# Patient Record
Sex: Female | Born: 2000 | Hispanic: No | Marital: Single | Smoking: Never smoker
Health system: Southern US, Community
[De-identification: ages and names within clinical notes are randomized; demographics above are authoritative.]

---

## 2019-04-07 ENCOUNTER — Other Ambulatory Visit: Payer: Self-pay

## 2019-04-07 ENCOUNTER — Encounter (HOSPITAL_COMMUNITY): Payer: Self-pay

## 2019-04-07 ENCOUNTER — Ambulatory Visit (HOSPITAL_COMMUNITY)
Admission: EM | Admit: 2019-04-07 | Discharge: 2019-04-07 | Disposition: A | Discharge status: Home / Self Care | Payer: Medicaid Other | Attending: Internal Medicine | Admitting: Internal Medicine

## 2019-04-07 DIAGNOSIS — M549 Dorsalgia, unspecified: Secondary | ICD-10-CM | POA: Diagnosis not present

## 2019-04-07 MED ORDER — IBUPROFEN 400 MG PO TABS: 400.0000 mg | ORAL_TABLET | Freq: Four times a day (QID) | ORAL | 0 refills | Status: DC | PRN

## 2019-04-07 NOTE — ED Provider Notes (Signed)
[facility]    CSN: [id number] Arrival date & time: 04/07/19  1515      History   Chief Complaint Chief Complaint  Patient presents with  . Abdominal Pain    HPI Erika Henderson is a 18 y.o. female no past medical history comes to urgent care with a 3-day history of left-sided mid back pain.  Onset was fairly sudden and pain is of moderate severity.  Pain is worse on movement and relieved by rest.  No numbness or tingling.  No abdominal distention.  No nausea or vomiting.  No change in bowel movements.  No dysuria urgency or frequency.  No blood in the urine.  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  HPI  History reviewed. No pertinent past medical history.  There are no active problems to display for this patient.   History reviewed. No pertinent surgical history.  OB History   No obstetric history on file.      Home Medications    Prior to Admission medications   Medication Sig Start Date End Date Taking? Authorizing Provider  ibuprofen (ADVIL) 400 MG tablet Take 1 tablet (400 mg total) by mouth every 6 (six) hours as needed. 04/07/19   Trayce Maino, Myrene Galas, MD    Family History History reviewed. No pertinent family history.  Social History Social History   Tobacco Use  . Smoking status: Never Smoker  . Smokeless tobacco: Current User  Substance Use Topics  . Alcohol use: Never    Frequency: Never  . Drug use: Not on file     Allergies   Patient has no known allergies.   Review of Systems Review of Systems  Constitutional: Negative.   HENT: Negative.   Respiratory: Negative.   Cardiovascular: Negative.   Gastrointestinal: Negative.   Genitourinary: Negative.  Negative for dysuria, frequency, genital sores, hematuria and urgency.  Musculoskeletal: Positive for back pain. Negative for arthralgias, joint swelling, myalgias, neck pain and neck stiffness.  Skin: Negative.   Neurological: Negative.  Negative  for dizziness, weakness and light-headedness.  Psychiatric/Behavioral: Negative.      Physical Exam Triage Vital Signs ED Triage Vitals [04/07/19 1543]  Enc Vitals Group     BP (!) 129/44     Pulse Rate 88     Resp 17     Temp 97.8 F (36.6 C)     Temp Source Tympanic     SpO2 100 %     Weight 110 lb (49.9 kg)     Height      Head Circumference      Peak Flow      Pain Score 7     Pain Loc      Pain Edu?      Excl. in [place]?    No data found.  Updated Vital Signs BP (!) 129/44 (BP Location: Left Arm)   Pulse 88   Temp 97.8 F (36.6 C) (Tympanic)   Resp 17   Wt 49.9 kg   LMP 03/16/2019   SpO2 100%   Visual Acuity Right Eye Distance:   Left Eye Distance:   Bilateral Distance:    Right Eye Near:   Left Eye Near:    Bilateral Near:     Physical Exam Vitals signs and nursing note reviewed.  Constitutional:      Appearance: She is not ill-appearing or toxic-appearing.  Cardiovascular:     Rate and Rhythm: Normal rate and regular rhythm.  Pulmonary:  Effort: Pulmonary effort is normal.     Breath sounds: Normal breath sounds. No wheezing or rhonchi.  Abdominal:     General: Abdomen is flat. Bowel sounds are increased.     Palpations: Abdomen is soft.     Tenderness: There is no abdominal tenderness. There is no right CVA tenderness or left CVA tenderness. Negative signs include Murphy's sign, Rovsing's sign, McBurney's sign and psoas sign.     Hernia: No hernia is present.  Skin:    General: Skin is warm.     Capillary Refill: Capillary refill takes less than 2 seconds.     Coloration: Skin is not cyanotic.     Comments: Tenderness on palpation of the right paraspinal muscle over the lumbar spine.  Neurological:     General: No focal deficit present.     Mental Status: She is alert.      UC Treatments / Results  Labs (all labs ordered are listed, but only abnormal results are displayed) Labs Reviewed - No data to display  EKG   Radiology No  results found.  Procedures Procedures (including critical care time)  Medications Ordered in UC Medications - No data to display  Initial Impression / Assessment and Plan / UC Course  I have reviewed the triage vital signs and the nursing notes.  Pertinent labs & imaging results that were available during my care of the patient were reviewed by me and considered in my medical decision making (see chart for details).     1.  Musculoskeletal back pain: Warm compress Ibuprofen 400 mg every 6 hours as needed for back pain Patient has no neurologic signs If pain gets worse patient is advised to return to urgent care for further evaluation. Final Clinical Impressions(s) / UC Diagnoses   Final diagnoses:  Musculoskeletal back pain   Discharge Instructions   None    ED Prescriptions    Medication Sig Dispense Auth. Provider   ibuprofen (ADVIL) 400 MG tablet Take 1 tablet (400 mg total) by mouth every 6 (six) hours as needed. 30 tablet Tamaj Jurgens, Britta MccreedyPhilip O, MD     PDMP not reviewed this encounter.   Merrilee JanskyLamptey, Lilyana Lippman O, MD 04/07/19 [phone]

## 2019-04-07 NOTE — ED Triage Notes (Signed)
Pt states she has pain on the right side of her abdomin  x 3 days. Pt states the pain makes her feel sick. Pt states the pain is sharp and tender.

## 2019-04-21 ENCOUNTER — Other Ambulatory Visit: Payer: Self-pay

## 2019-04-21 ENCOUNTER — Encounter (HOSPITAL_COMMUNITY): Payer: Self-pay

## 2019-04-21 ENCOUNTER — Ambulatory Visit (HOSPITAL_COMMUNITY)
Admission: EM | Admit: 2019-04-21 | Discharge: 2019-04-21 | Disposition: A | Discharge status: Home / Self Care | Payer: Medicaid Other | Attending: Family Medicine | Admitting: Family Medicine

## 2019-04-21 ENCOUNTER — Ambulatory Visit (INDEPENDENT_AMBULATORY_CARE_PROVIDER_SITE_OTHER): Payer: Medicaid Other

## 2019-04-21 DIAGNOSIS — R1011 Right upper quadrant pain: Secondary | ICD-10-CM | POA: Diagnosis not present

## 2019-04-21 LAB — COMPREHENSIVE METABOLIC PANEL
ALT: 12 U/L (ref 0–44)
AST: 18 U/L (ref 15–41)
Albumin: 4.2 g/dL (ref 3.5–5.0)
Alkaline Phosphatase: 53 U/L (ref 38–126)
Anion gap: 10 (ref 5–15)
BUN: 13 mg/dL (ref 6–20)
CO2: 26 mmol/L (ref 22–32)
Calcium: 9.6 mg/dL (ref 8.9–10.3)
Chloride: 100 mmol/L (ref 98–111)
Creatinine, Ser: 0.78 mg/dL (ref 0.44–1.00)
GFR calc Af Amer: >60 mL/min (ref >60)
GFR calc non Af Amer: >60 mL/min (ref >60)
Glucose, Bld: 94 mg/dL (ref 70–99)
Potassium: 4.1 mmol/L (ref 3.5–5.1)
Sodium: 136 mmol/L (ref 135–145)
Total Bilirubin: 0.3 mg/dL (ref 0.3–1.2)
Total Protein: 7.5 g/dL (ref 6.5–8.1)

## 2019-04-21 LAB — CBC WITH DIFFERENTIAL/PLATELET
Abs Immature Granulocytes: 0.04 10*3/uL (ref 0.00–0.07)
Basophils Absolute: 0.1 10*3/uL (ref 0.0–0.1)
Basophils Relative: 1 %
Eosinophils Absolute: 0 10*3/uL (ref 0.0–0.5)
Eosinophils Relative: 0 %
HCT: 38 % (ref 36.0–46.0)
Hemoglobin: 12.9 g/dL (ref 12.0–15.0)
Immature Granulocytes: 0 %
Lymphocytes Relative: 15 %
Lymphs Abs: 1.5 10*3/uL (ref 0.7–4.0)
MCH: 28.1 pg (ref 26.0–34.0)
MCHC: 33.9 g/dL (ref 30.0–36.0)
MCV: 82.8 fL (ref 80.0–100.0)
Monocytes Absolute: 0.3 10*3/uL (ref 0.1–1.0)
Monocytes Relative: 3 %
Neutro Abs: 8.3 10*3/uL — ABNORMAL HIGH (ref 1.7–7.7)
Neutrophils Relative %: 81 %
Platelets: 429 10*3/uL — ABNORMAL HIGH (ref 150–400)
RBC: 4.59 MIL/uL (ref 3.87–5.11)
RDW: 11.6 % (ref 11.5–15.5)
WBC: 10.3 10*3/uL (ref 4.0–10.5)
nRBC: 0 % (ref 0.0–0.2)

## 2019-04-21 MED ORDER — IBUPROFEN 600 MG PO TABS: 600.0000 mg | ORAL_TABLET | Freq: Three times a day (TID) | ORAL | 0 refills | Status: AC

## 2019-04-21 NOTE — ED Provider Notes (Signed)
[facility]   [id number] 04/21/19 Arrival Time: 1761  ASSESSMENT & PLAN:  1. Right upper quadrant abdominal pain     I have personally viewed the imaging studies ordered this visit. AAS: No worrisome finding.  Meds ordered this encounter  Medications  . ibuprofen (ADVIL) 600 MG tablet    Sig: Take 1 tablet (600 mg total) by mouth 3 (three) times daily with meals.    Dispense:  21 tablet    Refill:  0   Question gallbladder related. Discussed.  Pending: Orders Placed This Encounter  . Comprehensive metabolic panel  . CBC with Differential    Follow-up Information    [facility].   Specialty: Emergency Medicine Why: If symptoms worsen in any way. Contact information: [address] [id number] [place] [phone]       Schedule an appointment as soon as possible for a visit  with [facility].   Specialty: General Surgery Contact information: [place] [address] [phone]            Discharge Instructions     You have been seen today for abdominal pain. Your evaluation was not suggestive of any emergent condition requiring medical intervention at this time. We are awaiting the results of the blood tests drawn this evening. However, some abdominal problems make take more time to appear. Therefore, it is very important for you to pay attention to any new symptoms or worsening of your current condition.  Please return here or to the Emergency Department immediately should you begin to feel worse in any way or have any of the following symptoms: increasing or different abdominal pain, persistent vomiting, inability to drink fluids, fevers, or shaking chills.      Reviewed expectations re: course of current medical issues. Questions answered. Outlined signs and symptoms indicating need for more acute intervention. Patient verbalized  understanding. After Visit Summary given.   SUBJECTIVE: History from: patient. Erika Henderson is a 18 y.o. female who presents with complaint of intermittent RUQ abdominal discomfort. Seen here approx 2 weeks ago for R sided back pain. "Feels like it moved to the front now". Discomfort described as dull; without radiation; occasionally wakes her at night. Symptoms are stable since beginning but questions slight worsening over the past several days. Has been going to work; Paediatric nurse. Fever: absent. Aggravating factors: include certain movements involving abdominal muscles. Alleviating factors: have not been identified. Associated symptoms: mild nausea today without emesis. She denies arthralgias, belching, chills, constipation, diarrhea, dysuria, headache, myalgias and sweats. Appetite: normal. PO intake: normal. Pain is not related to eating. Ambulatory without assistance. Urinary symptoms: none. Bowel movements: have not significantly changed; last bowel movement yesterday and without blood. History of similar: no. OTC treatment: occasional ibuprofen; "helps a little".  LMP: finished last week; regular.  History reviewed. No pertinent surgical history.  ROS: As per HPI. All other systems negative.  OBJECTIVE:  Vitals:   04/21/19 1807  BP: 117/65  Pulse: 75  Resp: 16  Temp: 98.1 F (36.7 C)  TempSrc: Temporal  SpO2: 100%    General appearance: alert, oriented, no acute distress HEENT: [place]; AT; oropharynx moist Lungs: clear to auscultation bilaterally; unlabored respirations Heart: regular rate and rhythm Abdomen: soft; without distention; moderate and poorly localized tenderness to palpation over her RUQ; no specific rib tenderness; normal bowel sounds; without masses or organomegaly; without guarding or rebound tenderness Back: without CVA tenderness; FROM  at waist Extremities: without LE edema; symmetrical; without gross deformities Skin: warm and dry Neurologic: normal  gait Psychological: alert and cooperative; normal mood and affect  Labs: No results found for this or any previous visit. Labs Reviewed  COMPREHENSIVE METABOLIC PANEL  CBC WITH DIFFERENTIAL/PLATELET    Imaging: Dg Abd Acute W/chest  Result Date: 04/21/2019 CLINICAL DATA:  Per pt: has been having right side back pain, for two weeks, pain traveled to the right side of ribs, and now pain is in the front. Patient indicated that pain for two weeks posterior right side distal to the scapula, pain radiated to the right lateral mid ribs, for the past three days pain is a stabbing pain anteriorly, distal just under the right anterior lower ribs, today the pain was so intense it made her nauseous. No fever. No history of cardiac or respiratory disease. Non smoker. No surgeries. No history of HBP, no diabetes. EXAM: DG ABDOMEN ACUTE W/ 1V CHEST COMPARISON:  None. FINDINGS: There is no evidence of dilated bowel loops or free intraperitoneal air. No radiopaque calculi or other significant radiographic abnormality is seen. Heart size and mediastinal contours are within normal limits. Both lungs are clear. IMPRESSION: Negative abdominal radiographs.  No acute cardiopulmonary disease. Electronically Signed   By: Amie Portland M.D.   On: 04/21/2019 19:24   No Known Allergies                                       PMH: "Healthy".        Social History   Socioeconomic History  . Marital status: Single    Spouse name: Not on file  . Number of children: Not on file  . Years of education: Not on file  . Highest education level: Not on file  Occupational History  . Not on file  Social Needs  . Financial resource strain: Not on file  . Food insecurity    Worry: Not on file    Inability: Not on file  . Transportation needs    Medical: Not on file    Non-medical: Not on file  Tobacco Use  . Smoking status: Never Smoker  . Smokeless tobacco: Current User  Substance and Sexual Activity  . Alcohol use:  Never    Frequency: Never  . Drug use: Never  . Sexual activity: Yes  Lifestyle  . Physical activity    Days per week: Not on file    Minutes per session: Not on file  . Stress: Not on file  Relationships  . Social Musician on phone: Not on file    Gets together: Not on file    Attends religious service: Not on file    Active member of club or organization: Not on file    Attends meetings of clubs or organizations: Not on file    Relationship status: Not on file  . Intimate partner violence    Fear of current or ex partner: Not on file    Emotionally abused: Not on file    Physically abused: Not on file    Forced sexual activity: Not on file  Other Topics Concern  . Not on file  Social History Narrative  . Not on file   Family History  Problem Relation Age of Onset  . Healthy Father      Mardella Layman, MD 04/26/19 [phone]

## 2019-04-21 NOTE — ED Triage Notes (Signed)
Patient presents to Urgent Care with complaints of RUQ abdominal pain near her rib cage that radiates down her side since a few weeks ago. Patient reports it has progressively gotten worse, felt nauseous earlier today but did not throw up.

## 2019-04-21 NOTE — Discharge Instructions (Addendum)
You have been seen today for abdominal pain. Your evaluation was not suggestive of any emergent condition requiring medical intervention at this time. We are awaiting the results of the blood tests drawn this evening. However, some abdominal problems make take more time to appear. Therefore, it is very important for you to pay attention to any new symptoms or worsening of your current condition.  Please return here or to the Emergency Department immediately should you begin to feel worse in any way or have any of the following symptoms: increasing or different abdominal pain, persistent vomiting, inability to drink fluids, fevers, or shaking chills.

## 2019-06-23 ENCOUNTER — Ambulatory Visit (HOSPITAL_COMMUNITY)
Admission: EM | Admit: 2019-06-23 | Discharge: 2019-06-23 | Disposition: A | Discharge status: Home / Self Care | Payer: Medicaid Other | Attending: Family Medicine | Admitting: Family Medicine

## 2019-06-23 ENCOUNTER — Encounter (HOSPITAL_COMMUNITY): Payer: Self-pay

## 2019-06-23 ENCOUNTER — Other Ambulatory Visit: Payer: Self-pay

## 2019-06-23 DIAGNOSIS — Z20822 Contact with and (suspected) exposure to covid-19: Secondary | ICD-10-CM

## 2019-06-23 DIAGNOSIS — Z20828 Contact with and (suspected) exposure to other viral communicable diseases: Secondary | ICD-10-CM

## 2019-06-23 NOTE — ED Provider Notes (Signed)
Erika Henderson    CSN: [id number] Arrival date & time: 06/23/19  1645      History   Chief Complaint Chief Complaint  Patient presents with  . Covid Exposure    HPI Erika Henderson is a 18 y.o. female.   She is presenting with a possible exposure to Covid.  Her roommate tested positive today.  She has been asymptomatic.  She has been living in a shared apartment with a roommate.  She denies any close encounter with her roommate.  Roommate was severely symptomatic.  HPI  History reviewed. No pertinent past medical history.  There are no active problems to display for this patient.   History reviewed. No pertinent surgical history.  OB History   No obstetric history on file.      Home Medications    Prior to Admission medications   Medication Sig Start Date End Date Taking? Authorizing Provider  ibuprofen (ADVIL) 600 MG tablet Take 1 tablet (600 mg total) by mouth 3 (three) times daily with meals. 04/21/19   Vanessa Kick, MD    Family History Family History  Problem Relation Age of Onset  . Healthy Father     Social History Social History   Tobacco Use  . Smoking status: Never Smoker  . Smokeless tobacco: Current User  Substance Use Topics  . Alcohol use: Never    Frequency: Never  . Drug use: Yes    Types: Marijuana     Allergies   Patient has no known allergies.   Review of Systems Review of Systems  Constitutional: Negative for fever.  HENT: Negative for congestion.   Respiratory: Negative for cough.   Cardiovascular: Negative for chest pain.  Gastrointestinal: Negative for abdominal pain.  Musculoskeletal: Negative for gait problem.  Skin: Negative for color change.  Neurological: Negative for weakness.  Hematological: Negative for adenopathy.     Physical Exam Triage Vital Signs ED Triage Vitals  Enc Vitals Group     BP 06/23/19 1733 (!) 122/54     Pulse Rate 06/23/19 1733 78     Resp 06/23/19 1733 16     Temp  06/23/19 1733 98.8 F (37.1 C)     Temp Source 06/23/19 1733 Oral     SpO2 06/23/19 1733 100 %     Weight --      Height --      Head Circumference --      Peak Flow --      Pain Score 06/23/19 1732 0     Pain Loc --      Pain Edu? --      Excl. in [place]? --    No data found.  Updated Vital Signs BP (!) 122/54 (BP Location: Left Arm)   Pulse 78   Temp 98.8 F (37.1 C) (Oral)   Resp 16   SpO2 100%   Visual Acuity Right Eye Distance:   Left Eye Distance:   Bilateral Distance:    Right Eye Near:   Left Eye Near:    Bilateral Near:     Physical Exam Gen: NAD, alert, cooperative with exam, well-appearing ENT: normal lips, normal nasal mucosa,  Eye: normal EOM, normal conjunctiva and lids CV:  no edema, +2 pedal pulses   Resp: no accessory muscle use, non-labored,   Skin: no rashes, no areas of induration  Neuro: normal tone, normal sensation to touch Psych:  normal insight, alert and oriented MSK: normal gait, normal strength   UC Treatments /  Results  Labs (all labs ordered are listed, but only abnormal results are displayed) Labs Reviewed  NOVEL CORONAVIRUS, NAA (HOSP ORDER, SEND-OUT TO REF LAB; TAT 18-24 HRS)    EKG   Radiology No results found.  Procedures Procedures (including critical care time)  Medications Ordered in UC Medications - No data to display  Initial Impression / Assessment and Plan / UC Course  I have reviewed the triage vital signs and the nursing notes.  Pertinent labs & imaging results that were available during my care of the patient were reviewed by me and considered in my medical decision making (see chart for details).     Erika Henderson is a 18 year old female with possible exposure to Covid.  A test was taken.  She was counseled on supportive care and quarantining.  She will be provided results when they return.  Given occasions to follow-up and return.  Final Clinical Impressions(s) / UC Diagnoses   Final diagnoses:  Suspected  COVID-19 virus infection     Discharge Instructions     Please quarantine until you get your results      ED Prescriptions    None     PDMP not reviewed this encounter.   Myra Rude, MD 06/23/19 [phone]

## 2019-06-23 NOTE — Discharge Instructions (Signed)
Please quarantine until you get your results

## 2019-06-23 NOTE — ED Triage Notes (Signed)
Patient presents to Urgent Care with complaints of needing a covid test since her roommate tested positive for covid today. Patient reports her roommate has been feeling badly but the pt has no symptoms.

## 2019-06-25 LAB — NOVEL CORONAVIRUS, NAA (HOSP ORDER, SEND-OUT TO REF LAB; TAT 18-24 HRS): SARS-CoV-2, NAA: NOT DETECTED

## 2019-12-29 ENCOUNTER — Emergency Department (HOSPITAL_COMMUNITY)
Admission: EM | Admit: 2019-12-29 | Discharge: 2019-12-29 | Disposition: A | Discharge status: Home / Self Care | Payer: Medicaid Other | Attending: Emergency Medicine | Admitting: Emergency Medicine

## 2019-12-29 ENCOUNTER — Emergency Department (HOSPITAL_COMMUNITY): Payer: Medicaid Other

## 2019-12-29 ENCOUNTER — Encounter (HOSPITAL_COMMUNITY): Payer: Self-pay

## 2019-12-29 ENCOUNTER — Other Ambulatory Visit: Payer: Self-pay

## 2019-12-29 DIAGNOSIS — W2219XA Striking against or struck by other automobile airbag, initial encounter: Secondary | ICD-10-CM | POA: Diagnosis not present

## 2019-12-29 DIAGNOSIS — F17228 Nicotine dependence, chewing tobacco, with other nicotine-induced disorders: Secondary | ICD-10-CM | POA: Diagnosis not present

## 2019-12-29 DIAGNOSIS — M79642 Pain in left hand: Secondary | ICD-10-CM | POA: Diagnosis present

## 2019-12-29 DIAGNOSIS — Y92410 Unspecified street and highway as the place of occurrence of the external cause: Secondary | ICD-10-CM | POA: Insufficient documentation

## 2019-12-29 DIAGNOSIS — F1210 Cannabis abuse, uncomplicated: Secondary | ICD-10-CM | POA: Insufficient documentation

## 2019-12-29 DIAGNOSIS — Y998 Other external cause status: Secondary | ICD-10-CM | POA: Diagnosis not present

## 2019-12-29 DIAGNOSIS — M25522 Pain in left elbow: Secondary | ICD-10-CM | POA: Diagnosis not present

## 2019-12-29 DIAGNOSIS — Y9389 Activity, other specified: Secondary | ICD-10-CM | POA: Insufficient documentation

## 2019-12-29 MED ORDER — IBUPROFEN 800 MG PO TABS
800.0000 mg | ORAL_TABLET | Freq: Once | ORAL | Status: AC
  Administered 2019-12-29: 800 mg via ORAL
  Filled 2019-12-29: qty 1
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
## 2019-12-29 NOTE — Discharge Instructions (Signed)
Take ibuprofen 3 times a day with meals.  Do not take other anti-inflammatories at the same time (Advil, Motrin, naproxen, Aleve). You may supplement with Tylenol if you need further pain control. Use ice packs or heating pads if this helps control your pain. Use muscle creams such as Salonpas, icy hot, BenGay, Biofreeze to help with pain control. You likely have continued muscle stiffness and soreness over the next couple days.  Follow-up with primary care in 1 week if your symptoms are not improving. Return to the emergency room if you develop vision changes, vomiting, slurred speech, numbness, loss of bowel or bladder control, or any new or worsening symptoms.
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
## 2020-11-12 ENCOUNTER — Emergency Department (HOSPITAL_COMMUNITY): Payer: Medicaid Other

## 2020-11-12 ENCOUNTER — Encounter (HOSPITAL_COMMUNITY): Payer: Self-pay | Admitting: *Deleted

## 2020-11-12 ENCOUNTER — Emergency Department (HOSPITAL_COMMUNITY)
Admission: EM | Admit: 2020-11-12 | Discharge: 2020-11-12 | Disposition: A | Discharge status: Home / Self Care | Payer: Medicaid Other | Attending: Emergency Medicine | Admitting: Emergency Medicine

## 2020-11-12 DIAGNOSIS — O4690 Antepartum hemorrhage, unspecified, unspecified trimester: Secondary | ICD-10-CM

## 2020-11-12 DIAGNOSIS — F17200 Nicotine dependence, unspecified, uncomplicated: Secondary | ICD-10-CM | POA: Insufficient documentation

## 2020-11-12 DIAGNOSIS — F1210 Cannabis abuse, uncomplicated: Secondary | ICD-10-CM | POA: Diagnosis not present

## 2020-11-12 DIAGNOSIS — Z3A01 Less than 8 weeks gestation of pregnancy: Secondary | ICD-10-CM | POA: Insufficient documentation

## 2020-11-12 DIAGNOSIS — O4691 Antepartum hemorrhage, unspecified, first trimester: Secondary | ICD-10-CM | POA: Diagnosis not present

## 2020-11-12 DIAGNOSIS — O26891 Other specified pregnancy related conditions, first trimester: Secondary | ICD-10-CM | POA: Diagnosis present

## 2020-11-12 DIAGNOSIS — R58 Hemorrhage, not elsewhere classified: Secondary | ICD-10-CM

## 2020-11-12 LAB — URINALYSIS, ROUTINE W REFLEX MICROSCOPIC: RBC / HPF: >50 RBC/hpf — ABNORMAL HIGH (ref 0–5)

## 2020-11-12 LAB — COMPREHENSIVE METABOLIC PANEL
ALT: 11 U/L (ref 0–44)
AST: 18 U/L (ref 15–41)
Albumin: 4.5 g/dL (ref 3.5–5.0)
Alkaline Phosphatase: 42 U/L (ref 38–126)
Anion gap: 9 (ref 5–15)
BUN: 10 mg/dL (ref 6–20)
CO2: 24 mmol/L (ref 22–32)
Calcium: 9.3 mg/dL (ref 8.9–10.3)
Chloride: 107 mmol/L (ref 98–111)
Creatinine, Ser: 0.83 mg/dL (ref 0.44–1.00)
GFR, Estimated: >60 mL/min (ref >60)
Glucose, Bld: 92 mg/dL (ref 70–99)
Potassium: 3.5 mmol/L (ref 3.5–5.1)
Sodium: 140 mmol/L (ref 135–145)
Total Bilirubin: 0.6 mg/dL (ref 0.3–1.2)
Total Protein: 7.5 g/dL (ref 6.5–8.1)

## 2020-11-12 LAB — CBC WITH DIFFERENTIAL/PLATELET
Abs Immature Granulocytes: 0.02 10*3/uL (ref 0.00–0.07)
Basophils Absolute: 0.1 10*3/uL (ref 0.0–0.1)
Basophils Relative: 1 %
Eosinophils Absolute: 0.1 10*3/uL (ref 0.0–0.5)
Eosinophils Relative: 1 %
HCT: 44.4 % (ref 36.0–46.0)
Hemoglobin: 14.5 g/dL (ref 12.0–15.0)
Immature Granulocytes: 0 %
Lymphocytes Relative: 20 %
Lymphs Abs: 1.2 10*3/uL (ref 0.7–4.0)
MCH: 27.3 pg (ref 26.0–34.0)
MCHC: 32.7 g/dL (ref 30.0–36.0)
MCV: 83.5 fL (ref 80.0–100.0)
Monocytes Absolute: 0.3 10*3/uL (ref 0.1–1.0)
Monocytes Relative: 5 %
Neutro Abs: 4.4 10*3/uL (ref 1.7–7.7)
Neutrophils Relative %: 73 %
Platelets: 282 10*3/uL (ref 150–400)
RBC: 5.32 MIL/uL — ABNORMAL HIGH (ref 3.87–5.11)
RDW: 12.8 % (ref 11.5–15.5)
WBC: 6.1 10*3/uL (ref 4.0–10.5)
nRBC: 0 % (ref 0.0–0.2)

## 2020-11-12 LAB — WET PREP, GENITAL
Clue Cells Wet Prep HPF POC: NEGATIVE — AB
Sperm: NEGATIVE
Trich, Wet Prep: NEGATIVE — AB
Yeast Wet Prep HPF POC: NEGATIVE — AB

## 2020-11-12 LAB — ABO/RH: ABO/RH(D): O POS

## 2020-11-12 LAB — HCG, QUANTITATIVE, PREGNANCY: hCG, Beta Chain, Quant, S: 337 m[IU]/mL — ABNORMAL HIGH (ref <5)

## 2020-11-12 NOTE — Discharge Instructions (Addendum)
Like we discussed, below is the contact information for Dr. Vergie Living.  His office should be contacting you to schedule an appointment in 2 days so that you can be reevaluated and have your quantitative hCG test repeated.  Please continue to monitor your symptoms closely.  If you feel like you are having worsening bleeding, shortness of breath, lightheadedness, please come back to the emergency department so that you can be reevaluated.  Otherwise, please make sure you follow-up with Dr. Vergie Living in 2 days.  It was a pleasure to meet you both.

## 2020-11-12 NOTE — ED Triage Notes (Signed)
Pt had positive pregnancy test on 4/20. She reports bleeding upon waking up this morning. LMP 09/19/20.

## 2020-11-12 NOTE — ED Provider Notes (Signed)
[facility]-EMERGENCY DEPT Provider Note   CSN: [id number] Arrival date & time: 11/12/20  1203     History Chief Complaint  Patient presents with  . Vaginal Bleeding    Erika Henderson is a 20 y.o. female.  HPI Pt is a G1 20 y/o female that presents to the ED due to vaginal spotting. She states that her LNMP was 3/2. She has been having unprotected sex with her boyfriend. She states that 4/20 she had a positive home pregnancy test and has a f/u with the health department tomorrow for confirmatory testing. This AM she woke up and noticed a small amount of blood when wiping. She then noticed a larger amount of blood when wiping a 2nd time. No further bleeding. She reports mild suprapubic cramping. No n/v/d, CP, SOB, urinary complaints, vaginal discharge. This is her first pregnancy. No history of miscarriages.     History reviewed. No pertinent past medical history.  There are no problems to display for this patient.   History reviewed. No pertinent surgical history.   OB History    Gravida  1   Para      Term      Preterm      AB      Living        SAB      IAB      Ectopic      Multiple      Live Births              Family History  Problem Relation Age of Onset  . Healthy Father     Social History   Tobacco Use  . Smoking status: Never Smoker  . Smokeless tobacco: Current User  Vaping Use  . Vaping Use: Never used  Substance Use Topics  . Alcohol use: Never  . Drug use: Yes    Types: Marijuana    Home Medications Prior to Admission medications   Medication Sig Start Date End Date Taking? Authorizing Provider  Prenatal Vit-Fe Fumarate-FA (MULTIVITAMIN-PRENATAL) 27-0.8 MG TABS tablet Take 1 tablet by mouth daily at 12 noon.   Yes [provider]  ibuprofen (ADVIL) 600 MG tablet Take 1 tablet (600 mg total) by mouth 3 (three) times daily with meals. Patient not taking: Reported on 11/12/2020 04/21/19    Mardella LaymanHagler, Brian, MD    Allergies    Patient has no known allergies.  Review of Systems   Review of Systems  All other systems reviewed and are negative. Ten systems reviewed and are negative for acute change, except as noted in the HPI.   Physical Exam Updated Vital Signs BP (!) 147/111   Pulse (!) 117   Temp 98 F (36.7 C)   Resp 19   LMP 09/21/2020   SpO2 97%   Physical Exam Vitals and nursing note reviewed.  Constitutional:      General: She is not in acute distress.    Appearance: Normal appearance. She is not ill-appearing, toxic-appearing or diaphoretic.  HENT:     Head: Normocephalic and atraumatic.     Right Ear: External ear normal.     Left Ear: External ear normal.     Nose: Nose normal.     Mouth/Throat:     Mouth: Mucous membranes are moist.     Pharynx: Oropharynx is clear. No oropharyngeal exudate or posterior oropharyngeal erythema.  Eyes:     General: No scleral icterus.       Right eye:  No discharge.        Left eye: No discharge.     Extraocular Movements: Extraocular movements intact.     Conjunctiva/sclera: Conjunctivae normal.  Cardiovascular:     Rate and Rhythm: Normal rate and regular rhythm.     Pulses: Normal pulses.     Heart sounds: Normal heart sounds. No murmur heard. No friction rub. No gallop.   Pulmonary:     Effort: Pulmonary effort is normal. No respiratory distress.     Breath sounds: Normal breath sounds. No stridor. No wheezing, rhonchi or rales.  Abdominal:     General: Abdomen is flat.     Palpations: Abdomen is soft.     Tenderness: There is abdominal tenderness.     Comments: Abdomen is flat and soft. Very mild tenderness noted in the suprapubic region with deep palpation.  Genitourinary:    Comments: Female nursing chaperone present. Normal appearing vulvar region. Normal appearing vaginal mucosa. Moderate amount of blood noted in the vaginal vault. No clots or obvious POC. Closed retroverted cervical os. No CMT or adnexal  tenderness. Musculoskeletal:        General: Normal range of motion.     Cervical back: Normal range of motion and neck supple. No tenderness.  Skin:    General: Skin is warm and dry.  Neurological:     General: No focal deficit present.     Mental Status: She is alert and oriented to person, place, and time.  Psychiatric:        Mood and Affect: Mood normal.        Behavior: Behavior normal.    ED Results / Procedures / Treatments   Labs (all labs ordered are listed, but only abnormal results are displayed) Labs Reviewed  WET PREP, GENITAL - Abnormal; Notable for the following components:      Result Value   Yeast Wet Prep HPF POC NEGATIVE (*)    Trich, Wet Prep NEGATIVE (*)    Clue Cells Wet Prep HPF POC NEGATIVE (*)    WBC, Wet Prep HPF POC MANY (*)    All other components within normal limits  CBC WITH DIFFERENTIAL/PLATELET - Abnormal; Notable for the following components:   RBC 5.32 (*)    All other components within normal limits  URINALYSIS, ROUTINE W REFLEX MICROSCOPIC - Abnormal; Notable for the following components:   Color, Urine RED (*)    APPearance TURBID (*)    Glucose, UA   (*)    Value: TEST NOT REPORTED DUE TO COLOR INTERFERENCE OF URINE PIGMENT   Hgb urine dipstick   (*)    Value: TEST NOT REPORTED DUE TO COLOR INTERFERENCE OF URINE PIGMENT   Bilirubin Urine   (*)    Value: TEST NOT REPORTED DUE TO COLOR INTERFERENCE OF URINE PIGMENT   Ketones, ur   (*)    Value: TEST NOT REPORTED DUE TO COLOR INTERFERENCE OF URINE PIGMENT   Protein, ur   (*)    Value: TEST NOT REPORTED DUE TO COLOR INTERFERENCE OF URINE PIGMENT   Nitrite   (*)    Value: TEST NOT REPORTED DUE TO COLOR INTERFERENCE OF URINE PIGMENT   Leukocytes,Ua   (*)    Value: TEST NOT REPORTED DUE TO COLOR INTERFERENCE OF URINE PIGMENT   RBC / HPF >50 (*)    Bacteria, UA RARE (*)    All other components within normal limits  HCG, QUANTITATIVE, PREGNANCY - Abnormal; Notable for the following  components:  hCG, Beta Chain, Quant, S 337 (*)    All other components within normal limits  COMPREHENSIVE METABOLIC PANEL  ABO/RH  GC/CHLAMYDIA PROBE AMP ([place]) NOT AT [facility]   EKG None  Radiology US OB LESS THAN 14 WEEKS WITH OB TRANSVAGINAL  Result Date: 11/12/2020 CLINICAL DATA:  Vaginal bleeding. Estimated gestational age of [redacted] weeks, 5 days by LMP. EXAM: OBSTETRIC <14 WK [place] AND TRANSVAGINAL OB US TECHNIQUE: Both transabdominal and transvaginal ultrasound examinations were performed for complete evaluation of the gestation as well as the maternal uterus, adnexal regions, and pelvic cul-de-sac. Transvaginal technique was performed to assess early pregnancy. COMPARISON:  None. FINDINGS: Intrauterine gestational sac: None. Maternal uterus/adnexae: Unremarkable. IMPRESSION: 1. No IUP is visualized. By definition, in the setting of a positive pregnancy test, this reflects a pregnancy of unknown location. Differential considerations include early normal IUP, abnormal IUP/missed abortion, or nonvisualized ectopic pregnancy. Serial beta HCG is suggested. Consider repeat pelvic ultrasound in 14 days. Electronically Signed   By: Obie Dredge M.D.   On: 11/12/2020 14:37    Procedures Procedures   Medications Ordered in ED Medications - No data to display  ED Course  I have reviewed the triage vital signs and the nursing notes.  Pertinent labs & imaging results that were available during my care of the patient were reviewed by me and considered in my medical decision making (see chart for details).  Clinical Course as of 11/12/20 1933  Mon Nov 12, 2020  1449 I spoke to Dr. Vergie Living with OB/GYN.  He states that his office will reach out to the patient and schedule an appointment with her to follow-up in 2 days for repeat quantitative hCG. [LJ]    Clinical Course User Index [LJ] Placido Sou, PA-C   MDM Rules/Calculators/A&P                          Pt is a 20 y.o. female who  presents to the emergency department due to vaginal bleeding.  She recently had a positive home pregnancy test 5 days ago and began bleeding this morning.  Labs: CBC with an RBC of 5.32. CMP within normal limits. Beta hCG quantitative of 09/20/2005. UA red and turbid showing greater than 50 RBCs and rare bacteria. Wet prep shows many white blood cells. Gonorrhea/chlamydia is pending. ABO/Rh is O+  Imaging: Pelvic ultrasound shows no IUP visualized.  They say by definition in the setting of a positive pregnancy test is reflecting pregnancy of unknown location.  I, Placido Sou, PA-C, personally reviewed and evaluated these images and lab results as part of my medical decision-making.  Patient with vaginal bleeding in first trimester of pregnancy.  She does have a moderate amount of bleeding on my exam but no POC or clots noted.  Very mild tenderness noted in the suprapubic region.  No cervical motion tenderness or adnexal tenderness.  Patient discussed with Dr. Vergie Living who is on-call with OB/GYN.  He states that his office will reach out to the patient and schedule a follow-up appointment for repeat quantitative testing in 2 days.  This was discussed with the patient at length.  She verbalized understanding the above plan.  Feel the patient is stable for discharge at this time and she is agreeable.  She was given very strict return precautions and knows to return to the emergency department with any new or worsening symptoms.  Her questions were answered and she was amicable at the time  of discharge.  Note: Portions of this report may have been transcribed using voice recognition software. Every effort was made to ensure accuracy; however, inadvertent computerized transcription errors may be present.   Final Clinical Impression(s) / ED Diagnoses Final diagnoses:  Vaginal bleeding in pregnancy   Rx / DC Orders ED Discharge Orders    None       Placido Sou, PA-C 11/12/20  1933    Arby Barrette, MD 11/20/20 1345

## 2020-11-13 LAB — GC/CHLAMYDIA PROBE AMP (~~LOC~~) NOT AT ARMC
Chlamydia: NEGATIVE
Comment: NEGATIVE
Comment: NORMAL
Neisseria Gonorrhea: NEGATIVE

## 2020-11-14 ENCOUNTER — Ambulatory Visit (INDEPENDENT_AMBULATORY_CARE_PROVIDER_SITE_OTHER): Payer: Medicaid Other

## 2020-11-14 ENCOUNTER — Other Ambulatory Visit: Payer: Self-pay

## 2020-11-14 VITALS — Ht 63.0 in | Wt 106.0 lb

## 2020-11-14 DIAGNOSIS — O4690 Antepartum hemorrhage, unspecified, unspecified trimester: Secondary | ICD-10-CM

## 2020-11-14 LAB — BETA HCG QUANT (REF LAB): hCG Quant: 51 m[IU]/mL

## 2020-11-14 NOTE — Progress Notes (Signed)
Patient was assessed and managed by nursing staff during this encounter. I have reviewed the chart and agree with the documentation and plan. I have also made any necessary editorial changes.  Quant HCG trending down. Patient without any pelvic pain and vaginal bleeding is slowly improving. Plan for repeat quant HCG in 1 week with provider visit. Patient with likely spontaneous miscarriage  Catalina Antigua, MD 11/14/2020 2:27 PM

## 2020-11-14 NOTE — Progress Notes (Signed)
Pt here today for Stat beta hcg for followPt states still having vaginal bleeding and passed only 1 clot since Monday after leaving ER. Pt changing pad every 2-3 hours, not soaking. Denies any cramps or pain.

## 2020-11-14 NOTE — Progress Notes (Signed)
Left message on pt's phone and emergency contact for results. Pt advised to return call for results and recommendations per Dr Jolayne Panther.   Judeth Cornfield, RN   Recalled pt and spoke with pt and SO. Pt advised recommendations per Dr Jolayne Panther. Pt has appt with provider and repeat beta on 11/22/20. Pt agreeable to date and time of appt.   Judeth Cornfield, RN 11/14/20

## 2020-11-22 ENCOUNTER — Other Ambulatory Visit: Payer: Self-pay

## 2020-11-22 ENCOUNTER — Ambulatory Visit (INDEPENDENT_AMBULATORY_CARE_PROVIDER_SITE_OTHER): Payer: Medicaid Other | Admitting: Family Medicine

## 2020-11-22 ENCOUNTER — Encounter: Payer: Self-pay | Admitting: Family Medicine

## 2020-11-22 VITALS — BP 118/82 | HR 90 | Wt 109.6 lb

## 2020-11-22 DIAGNOSIS — O3680X0 Pregnancy with inconclusive fetal viability, not applicable or unspecified: Secondary | ICD-10-CM | POA: Insufficient documentation

## 2020-11-22 NOTE — Progress Notes (Signed)
GYNECOLOGY OFFICE VISIT NOTE  History:   Erika Henderson is a 20 y.o. G1P0 here today for follow up of pregnancy of unknown location. She denies any abnormal vaginal discharge, bleeding, pelvic pain or other concerns.      SAB -positive UPT 4/20, VB reported 4/25 -hcg 337-->51, u/s demonstrated no IUP -today denies abdominal cramping or VB -unplanned but desired pregnancy, feeling sad -not currently on contraception  No past medical history on file.  No past surgical history on file.  The following portions of the patient's history were reviewed and updated as appropriate: allergies, current medications, past family history, past medical history, past social history, past surgical history and problem list.    Review of Systems:  Pertinent items noted in HPI and remainder of comprehensive ROS otherwise negative.  Physical Exam:  BP 118/82   Pulse 90   Wt 109 lb 9.6 oz (49.7 kg)   LMP 09/21/2020   Breastfeeding Unknown   BMI 19.41 kg/m  CONSTITUTIONAL: Well-developed, well-nourished female in no acute distress.  HEENT:  Normocephalic, atraumatic. External right and left ear normal. No scleral icterus.  NECK: Normal range of motion, supple, no masses noted on observation SKIN: No rash noted. Not diaphoretic. No erythema. No pallor. MUSCULOSKELETAL: Normal range of motion. No edema noted. NEUROLOGIC: Alert and oriented to person, place, and time. Normal muscle tone coordination. No cranial nerve deficit noted. PSYCHIATRIC: Normal mood and affect. Normal behavior. Normal judgment and thought content. CARDIOVASCULAR: Normal heart rate noted RESPIRATORY: Effort and breath sounds normal, no problems with respiration noted ABDOMEN: No masses noted. No other overt distention noted.   PELVIC: Normal appearing external genitalia; normal urethral meatus; normal appearing vaginal mucosa and cervix.  No abnormal discharge noted.  Normal uterine size, no other palpable masses, no  uterine or adnexal tenderness. Performed in the presence of a chaperone  Labs and Imaging No results found for this or any previous visit (from the past 168 hour(s)). US OB LESS THAN 14 WEEKS WITH OB TRANSVAGINAL  Result Date: 11/12/2020 CLINICAL DATA:  Vaginal bleeding. Estimated gestational age of [redacted] weeks, 5 days by LMP. EXAM: OBSTETRIC <14 WK [place] AND TRANSVAGINAL OB US TECHNIQUE: Both transabdominal and transvaginal ultrasound examinations were performed for complete evaluation of the gestation as well as the maternal uterus, adnexal regions, and pelvic cul-de-sac. Transvaginal technique was performed to assess early pregnancy. COMPARISON:  None. FINDINGS: Intrauterine gestational sac: None. Maternal uterus/adnexae: Unremarkable. IMPRESSION: 1. No IUP is visualized. By definition, in the setting of a positive pregnancy test, this reflects a pregnancy of unknown location. Differential considerations include early normal IUP, abnormal IUP/missed abortion, or nonvisualized ectopic pregnancy. Serial beta HCG is suggested. Consider repeat pelvic ultrasound in 14 days. Electronically Signed   By: Obie Dredge M.D.   On: 11/12/2020 14:37      Assessment and Plan:    1. Pregnancy of unknown anatomic location -Patient with downtrending quant 337-->51, no IUP on ultrasound 11/12/20 -Today patient is asymptomatic -Will repeat quant and continue to trend to <5, given patient is asymptomatic no need for further ultrasound. Suspect likely SAB. -Discussed implications of SAB, precautions, contraception, family planning. Patient elects to use condoms at this time, advised to wait at least 1 menstrual cycle prior to trying to conceive. Advised to start prenatal vitamins and avoid teratogens while not on contraception. -Encouraged to RTC for annual gyn visit, patient needs pap smear. - Beta hCG quant (ref lab)   Routine preventative health maintenance measures emphasized.  Please refer to After Visit Summary for  other counseling recommendations.   No follow-ups on file.    Total face-to-face time with patient: 30 minutes.  Over 50% of encounter was spent on counseling and coordination of care.   Alric Seton, MD OB Fellow, Faculty [facility] 11/22/2020 10:49 AM

## 2020-11-22 NOTE — Patient Instructions (Addendum)
-wait at least 1 menstrual cycle prior to trying to conceive again -take prenatal vitamins daily -avoid alcohol, drugs, meds that are harmful to baby while not on birth control -we will trend hcg (pregnancy hormone levels) to less than 5 -return for annual gyn exam (pap smear) etc.  Miscarriage A miscarriage is the loss of a pregnancy before the 20th week of pregnancy. Sometimes, a pregnancy ends before a woman knows that she is pregnant. If you lose a pregnancy, talk with your doctor about:  Questions you have about the loss of your baby.  How to work through your grief.  Plans for future pregnancy. What are the causes? Many times, the cause of this condition is not known. What increases the risk? These things may make a pregnant woman more likely to lose a pregnancy: Certain health conditions  Conditions that affect hormones, such as: ? Thyroid disease. ? Polycystic ovary syndrome.  Diabetes.  A disease that causes the body's disease-fighting system to attack itself by mistake.  Infections.  Bleeding problems.  Being very overweight. Lifestyle factors  Using products that have tobacco or nicotine in them.  Being around tobacco smoke.  Having alcohol.  Having a lot of caffeine.  Using drugs. Problems with reproductive organs or parts  Having a cervix that opens and thins before your due date. The cervix is the lowest part of your womb.  Having Asherman syndrome, which leads to: ? Scars in the womb. ? The womb being abnormal in shape.  Growths (fibroids) in the womb.  Problems in the body that are present at birth.  Infection of the cervix or womb. Personal or health history  Injury.  Having lost a pregnancy before.  Being younger than age 35 or older than age 61.  Being around a harmful substance, such as radiation.  Having lead or other heavy metals in: ? Things you eat or drink. ? The air around you.  Using certain medicines. What are the  signs or symptoms?  Blood or spots of blood coming from the vagina. You may also have cramps or pain.  Pain or cramps in the belly or low back.  Fluid or tissue coming out of the vagina. How is this treated? Sometimes, treatment is not needed. If you need treatment, you may be treated with:  A procedure to open the cervix more and take tissue out of the womb.  Medicines. You may get a shot of medicine called Rho(D) immune globulin. Follow these instructions at home: Medicines  Take over-the-counter and prescription medicines only as told by your doctor.  If you were prescribed antibiotic medicine, take it as told by your doctor. Do not stop taking it even if you start to feel better. Activity  Rest as told by your doctor. Ask your doctor what activities are safe for you.  Have someone help you at home during this time. General instructions  Watch how much tissue comes out of the vagina.  Watch the size of any blood clots that come out of the vagina.  Do not have sex or douche until your doctor says it is okay.  Do not put things, such as tampons, in your vagina until your doctor says it is okay.  To help you and your partner with grieving: ? Talk with your doctor. ? See a Veterinary surgeon.  When you are ready, talk with your doctor about: ? Things to do for your health. ? How you can be healthy if you get pregnant again.  Keep all follow-up visits.   Where to find more information  [employer]necologists: acog.org  [place] Department of Health and Cyt[employer] of Women's Health: http://hoffman.com/ Contact a doctor if:  You have a fever or chills.  There is bad-smelling fluid coming from the vagina.  You have more bleeding.  Tissue or clots of blood come out of your vagina. Get help right away if:  You have very bad cramps or pain in your back or belly.  You soak more than 2 large pads in an hour for more than 2  hours.  You get light-headed or weak.  You faint.  You feel sad, and you have sad thoughts a lot of the time.  You think about hurting yourself. Get help right awayif you feel like you may hurt yourself or others, or have thoughts about taking your own life. Go to your nearest emergency room or:  Call your local emergency services (911 in the [place]  Call the National Suicide Prevention Lifeline at 7854842512. This is open 24 hours a day.  Text the Crisis Text Line at 419-301-2030. Summary  A miscarriage is the loss of a pregnancy before the 20th week of pregnancy. Sometimes, a pregnancy ends before a woman knows that she is pregnant.  Follow instructions from your doctor about medicines and activity.  To help you and your partner with grieving, talk with your doctor or a counselor.  Keep all follow-up visits. This information is not intended to replace advice given to you by your health care provider. Make sure you discuss any questions you have with your health care provider. Document Revised: 01/06/2020 Document Reviewed: 01/06/2020 Elsevier Patient Education  2021 [employer].

## 2020-11-23 LAB — BETA HCG QUANT (REF LAB): hCG Quant: 5 m[IU]/mL

## 2020-12-19 ENCOUNTER — Ambulatory Visit: Payer: Medicaid Other | Admitting: Obstetrics and Gynecology

## 2021-01-10 ENCOUNTER — Other Ambulatory Visit (HOSPITAL_COMMUNITY)
Admission: RE | Admit: 2021-01-10 | Discharge: 2021-01-10 | Disposition: A | Discharge status: Home / Self Care | Payer: Medicaid Other | Source: Ambulatory Visit | Attending: Obstetrics and Gynecology | Admitting: Obstetrics and Gynecology

## 2021-01-10 ENCOUNTER — Other Ambulatory Visit: Payer: Self-pay

## 2021-01-10 ENCOUNTER — Ambulatory Visit (INDEPENDENT_AMBULATORY_CARE_PROVIDER_SITE_OTHER): Payer: Medicaid Other | Admitting: Family Medicine

## 2021-01-10 VITALS — BP 127/97 | HR 96 | Wt 115.0 lb

## 2021-01-10 DIAGNOSIS — Z01419 Encounter for gynecological examination (general) (routine) without abnormal findings: Secondary | ICD-10-CM

## 2021-01-10 NOTE — Progress Notes (Signed)
GYNECOLOGY OFFICE VISIT NOTE  History:   Erika Henderson is a 20 y.o. G1P0 here today for her annual gynecology exam.   [name] also mentioned her recent miscarriage from April 2022. She states that she is healing and emotionally feels hopeful for the future. She denies any new abnormal vaginal discharge, bleeding, pelvic pain or other concerns. She notes that her menstrual cycle returned on May 24th, her bleeding was lighter and in shorter duration, that previous periods. Since her last period in May 2022 she has had unprotected intercourse multiple times. She has been using a period tracker app and notes that her period for June is 1-2 days late. She states that she has been feeling periodic nausea, but no vomiting.       No past medical history on file.  No past surgical history on file.  The following portions of the patient's history were reviewed and updated as appropriate: allergies, current medications, past family history, past medical history, past social history, past surgical history and problem list.   Health Maintenance:   - Patient does not have a previous history of pap smears and one was performed today. - Mammogram: N/A  Review of Systems:  Pertinent items noted in HPI and remainder of comprehensive ROS otherwise negative.  Physical Exam:  BP (!) 127/97   Pulse 96   Wt 115 lb (52.2 kg)   LMP 12/11/2020 (Exact Date)   BMI 20.37 kg/m  CONSTITUTIONAL: Well-developed, well-nourished female in no acute distress.  HEENT:  Normocephalic, atraumatic.  NECK: Normal range of motion, supple, no masses noted on observation SKIN: No rash noted. Not diaphoretic. No erythema. No pallor. MUSCULOSKELETAL: Normal range of motion. No edema noted. NEUROLOGIC: Alert and oriented to person, place, and time. Normal muscle tone coordination. No cranial nerve deficit noted. PSYCHIATRIC: Normal mood and affect. Normal behavior. Normal judgment and thought  content. CARDIOVASCULAR: Normal heart rate, no murmurs, rubs, or gallops.  RESPIRATORY: Effort and breath sounds normal, no problems with respiration noted ABDOMEN: No masses noted. No other overt distention noted.   PELVIC: Normal appearing external genitalia; normal urethral meatus; normal appearing vaginal mucosa and cervix. Copious amounts of white discharge noted. Normal uterine size, no other palpable masses. No uterine or adnexal tenderness. Performed in the presence of a chaperone.   Labs and Imaging No results found for this or any previous visit (from the past 168 hour(s)). No results found.    Assessment and Plan:  1. Well woman exam with routine gynecological exam - Cytology - PAP( [place]) - Cervicovaginal ancillary only( [place]) - Patient was advised to contact the clinic in the event that she has any additional questions or concerns.   Routine preventative health maintenance measures emphasized. Please refer to After Visit Summary for other counseling recommendations.   Return in about 1 year (around 01/10/2022) for Yearly wellness exam.    Total face-to-face time with patient: 30 minutes.  Over 50% of encounter was spent on counseling and coordination of care.  Worden Nation 01/10/2021 5:25 PM   Attestation of Attending Supervision of Student:  I confirm that I have verified the information documented in the physician assistant student's note and that I have also personally reperformed the history, physical exam and all medical decision making activities.  I have verified that all services and findings are accurately documented in this student's note; and I agree with management and plan as outlined in the documentation.   Federico Flake,  MD, MPH, ABFM, [facility] Attending Physician Center for [facility]

## 2021-01-11 LAB — CERVICOVAGINAL ANCILLARY ONLY
Bacterial Vaginitis (gardnerella): POSITIVE — AB
Candida Glabrata: NEGATIVE
Candida Vaginitis: NEGATIVE
Chlamydia: NEGATIVE
Comment: NEGATIVE
Comment: NEGATIVE
Comment: NEGATIVE
Comment: NEGATIVE
Comment: NEGATIVE
Comment: NORMAL
Neisseria Gonorrhea: NEGATIVE
Trichomonas: NEGATIVE

## 2021-01-15 LAB — CYTOLOGY - PAP
Comment: NEGATIVE
Diagnosis: NEGATIVE
High risk HPV: NEGATIVE

## 2021-06-19 ENCOUNTER — Ambulatory Visit: Payer: Medicaid Other

## 2021-07-17 ENCOUNTER — Ambulatory Visit: Payer: Medicaid Other

## 2021-12-04 IMAGING — US US OB < 14 WEEKS - US OB TV
1 series · 14 of 28 positions shown · non-contrast
Comparison: None.

CLINICAL DATA: Vaginal bleeding. Estimated gestational age of 7
weeks, 5 days by LMP.

EXAM:
OBSTETRIC <14 WK US AND TRANSVAGINAL OB US
TECHNIQUE: Both transabdominal and transvaginal ultrasound examinations were
performed for complete evaluation of the gestation as well as the
maternal uterus, adnexal regions, and pelvic cul-de-sac.
Transvaginal technique was performed to assess early pregnancy.

[Series 1: us ob < 14 weeks - us ob tv · 14 of 46 slices shown]
[im 2/46]
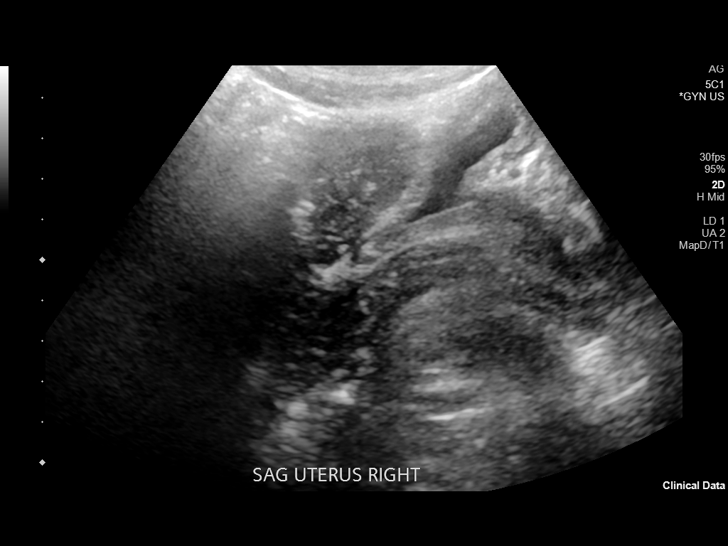
[im 6/46]
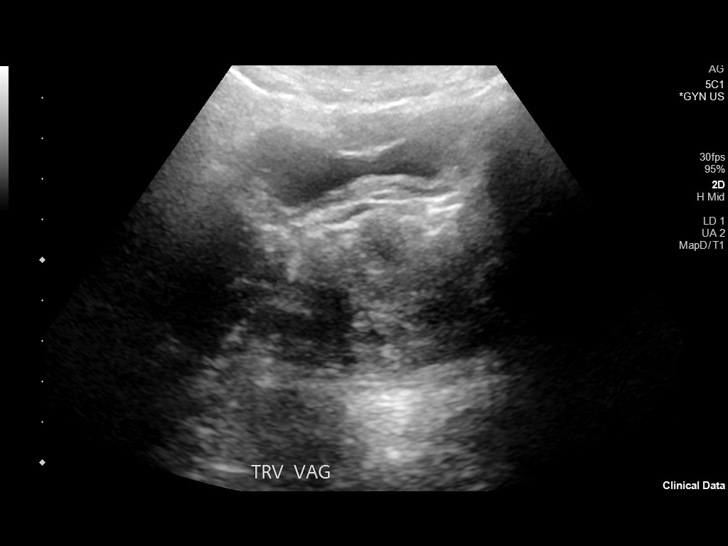
[im 9/46]
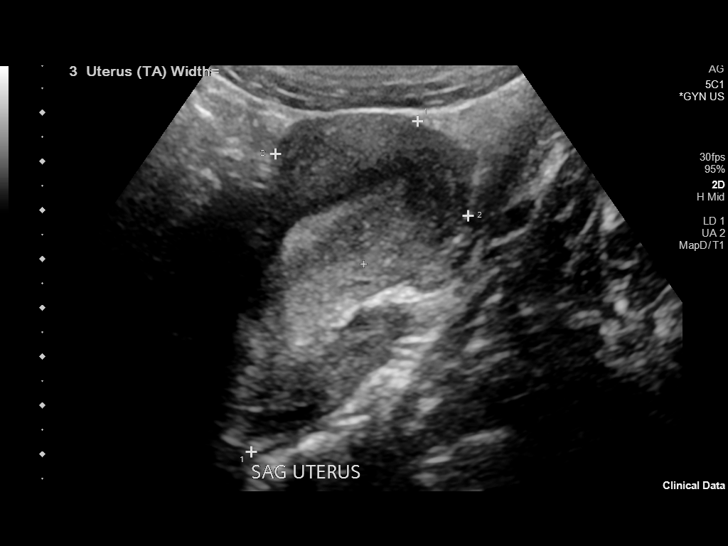
[im 12/46]
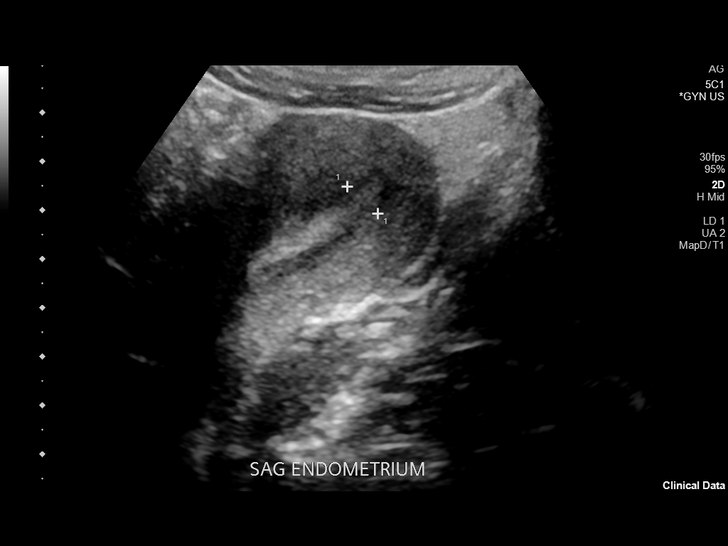
[im 16/46]
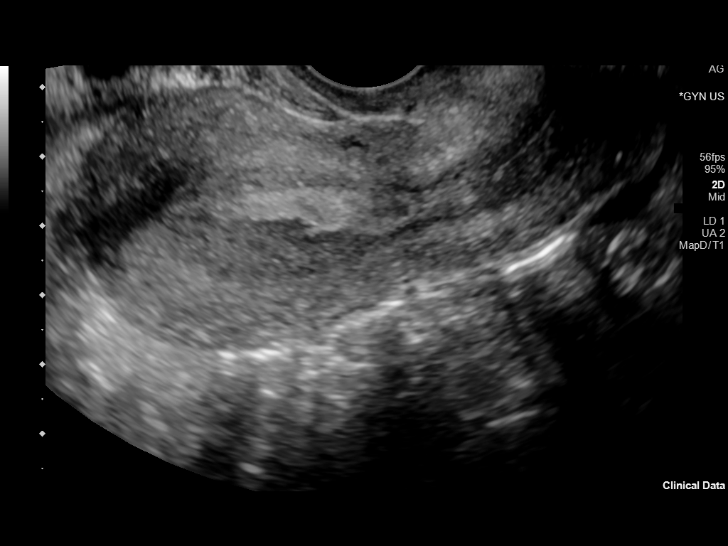
[im 19/46]
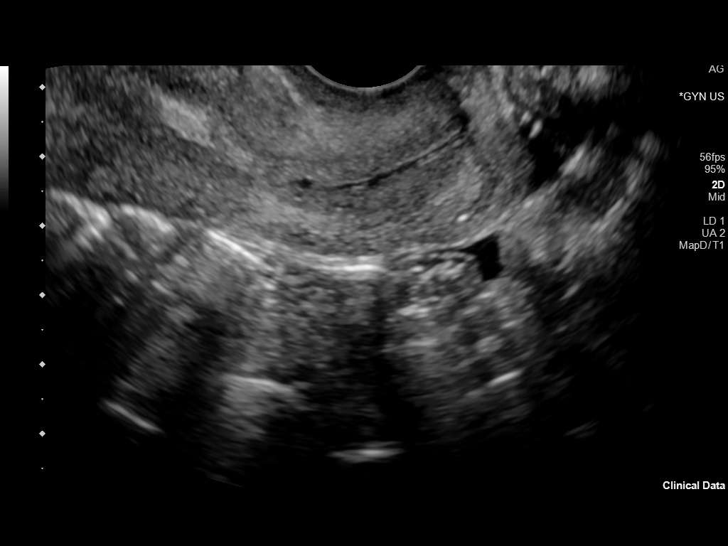
[im 22/46]
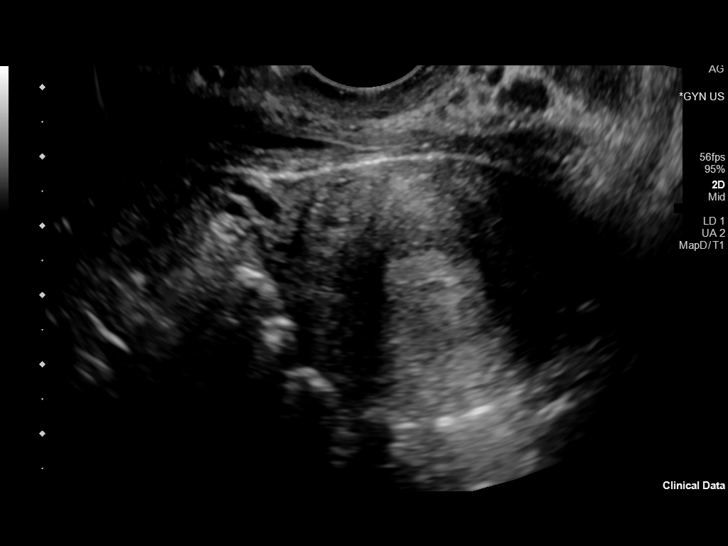
[im 26/46]
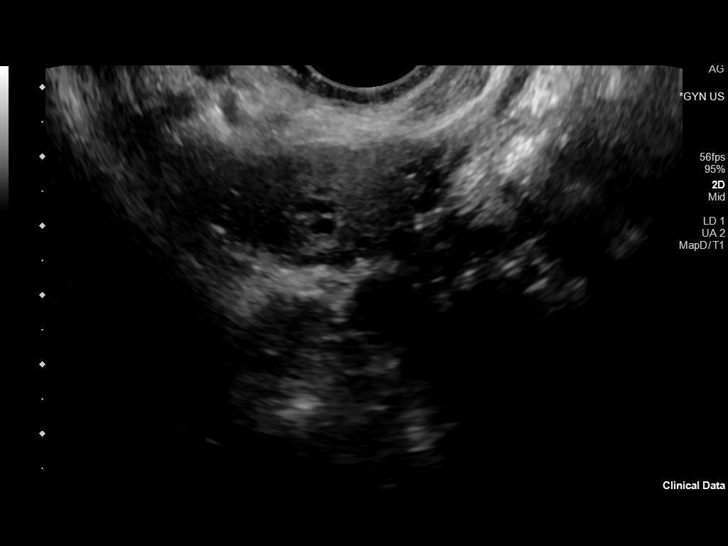
[im 29/46]
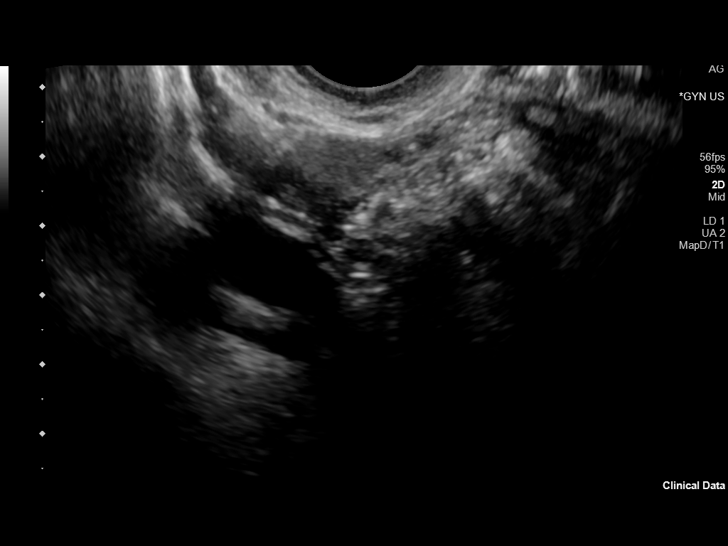
[im 32/46]
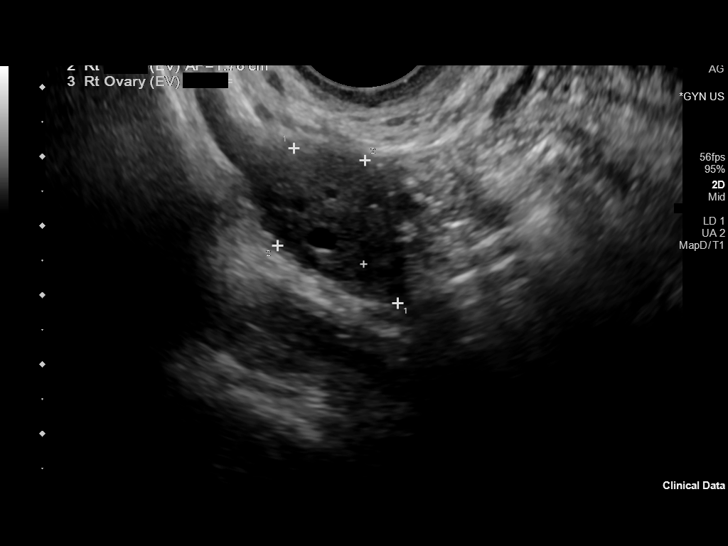
[im 36/46]
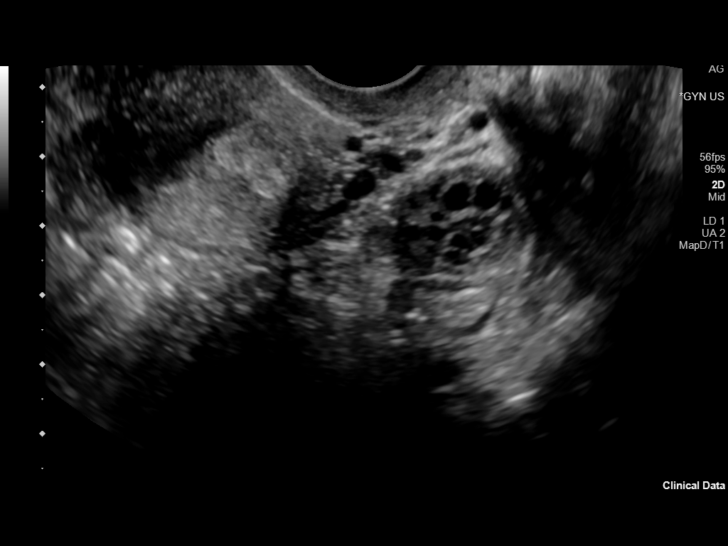
[im 39/46]
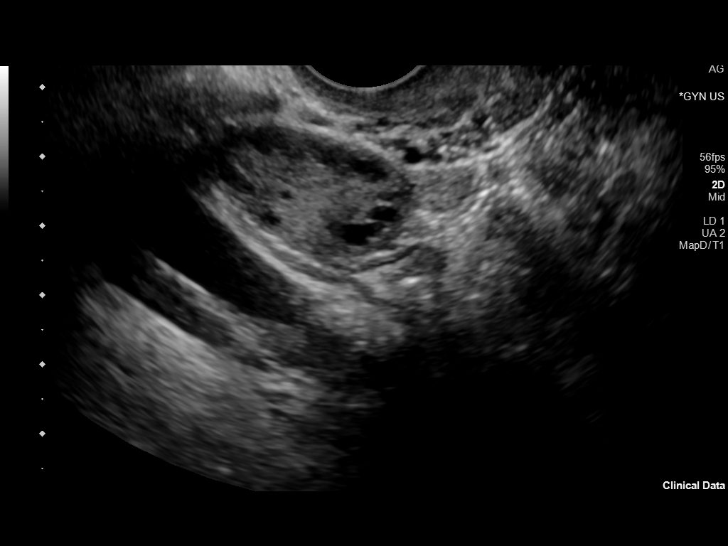
[im 42/46]
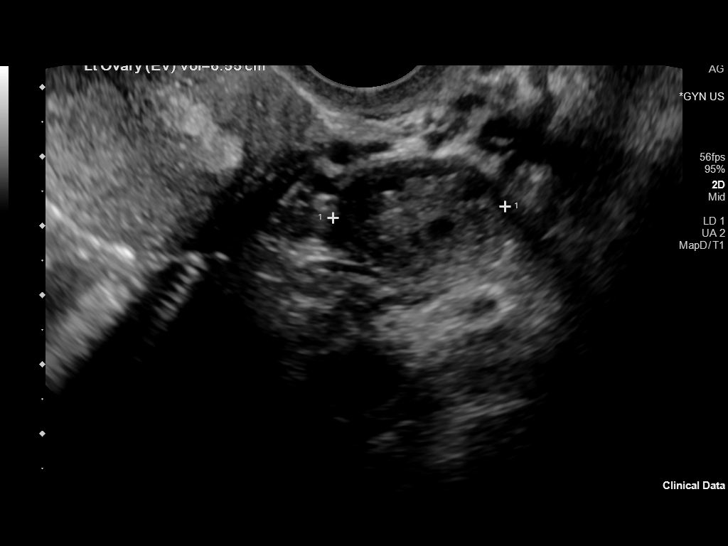
[im 46/46]
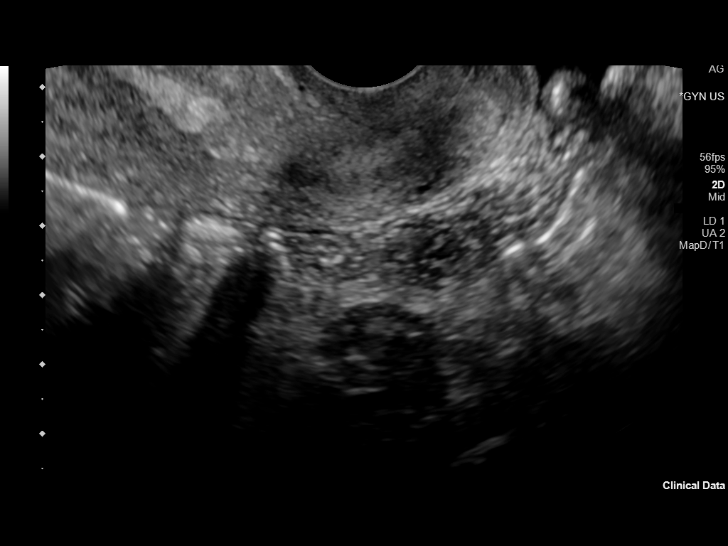

[14 of 28 positions shown; findings below may reference images not displayed]

FINDINGS: Intrauterine gestational sac: None.

Maternal uterus/adnexae: Unremarkable.
IMPRESSION: 1. No IUP is visualized. By definition, in the setting of a positive
pregnancy test, this reflects a pregnancy of unknown location.
Differential considerations include early normal IUP, abnormal
IUP/missed abortion, or nonvisualized ectopic pregnancy. Serial beta
HCG is suggested. Consider repeat pelvic ultrasound in 14 days.
